# Patient Record
Sex: Female | Born: 1975 | Race: White | Hispanic: No | Marital: Married | Smoking: Never smoker
Health system: Southern US, Community
[De-identification: ages and names within clinical notes are randomized; demographics above are authoritative.]

## PROBLEM LIST (undated history)

## (undated) ENCOUNTER — Inpatient Hospital Stay (HOSPITAL_COMMUNITY): Payer: Self-pay

## (undated) DIAGNOSIS — E039 Hypothyroidism, unspecified: Secondary | ICD-10-CM

## (undated) DIAGNOSIS — Z8619 Personal history of other infectious and parasitic diseases: Secondary | ICD-10-CM

## (undated) DIAGNOSIS — F32A Depression, unspecified: Secondary | ICD-10-CM

## (undated) DIAGNOSIS — K649 Unspecified hemorrhoids: Secondary | ICD-10-CM

## (undated) DIAGNOSIS — F329 Major depressive disorder, single episode, unspecified: Secondary | ICD-10-CM

## (undated) HISTORY — DX: Personal history of other infectious and parasitic diseases: Z86.19

## (undated) HISTORY — PX: TOOTH EXTRACTION: SUR596

## (undated) HISTORY — DX: Hypothyroidism, unspecified: E03.9

## (undated) HISTORY — DX: Unspecified hemorrhoids: K64.9

---

## 2010-10-20 ENCOUNTER — Other Ambulatory Visit (HOSPITAL_COMMUNITY): Payer: Self-pay | Admitting: Obstetrics & Gynecology

## 2010-10-20 DIAGNOSIS — N970 Female infertility associated with anovulation: Secondary | ICD-10-CM

## 2010-10-23 ENCOUNTER — Ambulatory Visit (HOSPITAL_COMMUNITY): Payer: Self-pay

## 2011-08-26 LAB — OB RESULTS CONSOLE ANTIBODY SCREEN: Antibody Screen: NEGATIVE

## 2011-08-26 LAB — OB RESULTS CONSOLE ABO/RH

## 2011-08-31 LAB — OB RESULTS CONSOLE GC/CHLAMYDIA
Chlamydia: NEGATIVE
Gonorrhea: NEGATIVE

## 2011-12-17 ENCOUNTER — Encounter (HOSPITAL_COMMUNITY): Payer: Self-pay | Admitting: *Deleted

## 2011-12-17 ENCOUNTER — Inpatient Hospital Stay (HOSPITAL_COMMUNITY): Payer: BC Managed Care – PPO

## 2011-12-17 ENCOUNTER — Inpatient Hospital Stay (HOSPITAL_COMMUNITY)
Admission: AD | Admit: 2011-12-17 | Discharge: 2011-12-17 | Disposition: A | Discharge status: Home / Self Care | Payer: BC Managed Care – PPO | Source: Ambulatory Visit | Attending: Obstetrics | Admitting: Obstetrics

## 2011-12-17 DIAGNOSIS — O26859 Spotting complicating pregnancy, unspecified trimester: Secondary | ICD-10-CM | POA: Insufficient documentation

## 2011-12-17 HISTORY — DX: Major depressive disorder, single episode, unspecified: F32.9

## 2011-12-17 HISTORY — DX: Depression, unspecified: F32.A

## 2011-12-17 MED ORDER — TERCONAZOLE 0.4 % VA CREA: 1.0000 | TOPICAL_CREAM | Freq: Every day | VAGINAL | Status: AC

## 2011-12-17 NOTE — MAU Note (Signed)
Pt reports spotting today

## 2011-12-17 NOTE — H&P (Signed)
CC: vaginal spotting  HPI: 36 yo G1 at 25'6 presents w/ vaginal spotting, bright red, with wiping after void. No ctx, no achy feelings in vagina but pt realized she is hyperalert. No recent IC. Baby moving. IVF preg, female factor. No vag itching/ d/c. Patient does note hitting herself in the abdomen with the corner of a today. This did not cause any pain or significant trauma.  PMH: hypothyroid  PGynHx: no h/o abnl paps, no LEEP  Meds: synthroid, PNV All: NKDA  PE: Filed Vitals:   12/17/11 1945  BP: 111/71  Pulse: 72  Temp: 98.4 F (36.9 C)  TempSrc: Oral  Resp: 18   Abdomen: Gravid, nontender, no fundal tenderness, no right upper quadrant pain Back: No costovertebral angle tenderness Lower extremities: Nontender, no edema GU: No blood seen in the vaginal vault, normal vaginal mucosa, normal cervix, no significant ectropion, scant thick vaginal discharge, cervix long and closed, no cervical bleeding, no uterine tenderness, no adnexal mass Rectal: Multiple nonthrombosed external hemorrhoids  Toco: No contractions seen FH 150s, no decelerations, 10 beat variability, 10 x 10 accelerations  Wet prep: Numerous lactobacilli, no RBCs, rare WBCs, yeast hyphae present  Assessment and plan: 36 year old G1 at 25 weeks and 6 days who complaints of scant vaginal spotting on toilet tissue x3 during voiding earlier today. No additional bleeding, no additional staining or spotting with additional voids. Good fetal movement and no contractions  Vaginal bleeding. Very minimal by patient report, none seen on exam. possibly related to yeast infection. No evidence for preterm labor, no evidence for cervical incompetence, no evidence for placental abruption. Will treat for yeast infection. Will check ultrasound to evaluate cervical length and placenta. FSN has been collected but no indication to send at this time  Fetal well being: Prematurity. Overall reactive status  Sable Knoles A. 12/17/2011  7:59 PM

## 2012-02-16 NOTE — L&D Delivery Note (Signed)
Delivery Note At 4:48 PM a healthy female was delivered via Vaginal, Spontaneous Delivery (Presentation: manually turned from Rt post occiput to Rt ant occuput).  APGAR: 9, 9; weight pending .   Placenta status: Intact, Spontaneous.  Cord: 3 vessels with the following complications: Short.  Cord pH: Not done  Anesthesia: Epidural  Episiotomy: none Lacerations: 2nd degree Suture Repair: vicryl rapide Est. Blood Loss (mL): 400  Mom to postpartum.  Baby to nursery-stable. Informed consent for circumcision obtained verbally.  Rodriguez Aguinaldo,MARIE-LYNE 04/04/2012, 5:34 PM

## 2012-02-25 LAB — OB RESULTS CONSOLE GBS: GBS: NEGATIVE

## 2012-03-23 ENCOUNTER — Other Ambulatory Visit: Payer: Self-pay | Admitting: Obstetrics & Gynecology

## 2012-03-24 ENCOUNTER — Telehealth (HOSPITAL_COMMUNITY): Payer: Self-pay | Admitting: *Deleted

## 2012-03-24 ENCOUNTER — Encounter (HOSPITAL_COMMUNITY): Payer: Self-pay | Admitting: *Deleted

## 2012-03-24 NOTE — Telephone Encounter (Signed)
Preadmission screen  

## 2012-03-30 ENCOUNTER — Inpatient Hospital Stay (HOSPITAL_COMMUNITY): Admission: RE | Admit: 2012-03-30 | Payer: BC Managed Care – PPO | Source: Ambulatory Visit

## 2012-04-03 ENCOUNTER — Telehealth (HOSPITAL_COMMUNITY): Payer: Self-pay | Admitting: *Deleted

## 2012-04-03 ENCOUNTER — Other Ambulatory Visit: Payer: Self-pay | Admitting: Obstetrics & Gynecology

## 2012-04-03 NOTE — Telephone Encounter (Signed)
Preadmission screen  

## 2012-04-04 ENCOUNTER — Encounter (HOSPITAL_COMMUNITY): Payer: Self-pay | Admitting: Anesthesiology

## 2012-04-04 ENCOUNTER — Inpatient Hospital Stay (HOSPITAL_COMMUNITY): Payer: BC Managed Care – PPO | Admitting: Anesthesiology

## 2012-04-04 ENCOUNTER — Encounter (HOSPITAL_COMMUNITY): Payer: Self-pay

## 2012-04-04 ENCOUNTER — Inpatient Hospital Stay (HOSPITAL_COMMUNITY)
Admission: RE | Admit: 2012-04-04 | Discharge: 2012-04-06 | DRG: 373 | Disposition: A | Discharge status: Home / Self Care | Payer: BC Managed Care – PPO | Source: Ambulatory Visit | Attending: Obstetrics & Gynecology | Admitting: Obstetrics & Gynecology

## 2012-04-04 DIAGNOSIS — O878 Other venous complications in the puerperium: Secondary | ICD-10-CM | POA: Diagnosis present

## 2012-04-04 DIAGNOSIS — E079 Disorder of thyroid, unspecified: Secondary | ICD-10-CM | POA: Diagnosis present

## 2012-04-04 DIAGNOSIS — O09519 Supervision of elderly primigravida, unspecified trimester: Secondary | ICD-10-CM | POA: Diagnosis present

## 2012-04-04 DIAGNOSIS — D62 Acute posthemorrhagic anemia: Secondary | ICD-10-CM | POA: Diagnosis not present

## 2012-04-04 DIAGNOSIS — O9903 Anemia complicating the puerperium: Secondary | ICD-10-CM | POA: Diagnosis not present

## 2012-04-04 DIAGNOSIS — E039 Hypothyroidism, unspecified: Secondary | ICD-10-CM | POA: Diagnosis present

## 2012-04-04 DIAGNOSIS — O480 Post-term pregnancy: Principal | ICD-10-CM | POA: Diagnosis present

## 2012-04-04 DIAGNOSIS — K649 Unspecified hemorrhoids: Secondary | ICD-10-CM | POA: Diagnosis present

## 2012-04-04 LAB — CBC
MCH: 30.5 pg (ref 26.0–34.0)
MCHC: 33.9 g/dL (ref 30.0–36.0)
MCV: 90.1 fL (ref 78.0–100.0)
Platelets: 176 10*3/uL (ref 150–400)
RBC: 3.93 MIL/uL (ref 3.87–5.11)

## 2012-04-04 MED ORDER — CITRIC ACID-SODIUM CITRATE 334-500 MG/5ML PO SOLN: 30.0000 mL | ORAL | Status: DC | PRN

## 2012-04-04 MED ORDER — ONDANSETRON HCL 4 MG/2ML IJ SOLN: 4.0000 mg | INTRAMUSCULAR | Status: DC | PRN

## 2012-04-04 MED ORDER — IBUPROFEN 600 MG PO TABS
600.0000 mg | ORAL_TABLET | Freq: Four times a day (QID) | ORAL | Status: DC
  Administered 2012-04-04 – 2012-04-06 (×7): 600 mg via ORAL
  Filled 2012-04-04 (×7): qty 1

## 2012-04-04 MED ORDER — ERYTHROMYCIN 5 MG/GM OP OINT
TOPICAL_OINTMENT | OPHTHALMIC | Status: AC
  Filled 2012-04-04: qty 1

## 2012-04-04 MED ORDER — ONDANSETRON HCL 4 MG PO TABS: 4.0000 mg | ORAL_TABLET | ORAL | Status: DC | PRN

## 2012-04-04 MED ORDER — DIPHENHYDRAMINE HCL 50 MG/ML IJ SOLN: 12.5000 mg | INTRAMUSCULAR | Status: DC | PRN

## 2012-04-04 MED ORDER — PRENATAL MULTIVITAMIN CH
1.0000 | ORAL_TABLET | Freq: Every day | ORAL | Status: DC
  Administered 2012-04-04 – 2012-04-06 (×3): 1 via ORAL
  Filled 2012-04-04 (×3): qty 1

## 2012-04-04 MED ORDER — OXYCODONE-ACETAMINOPHEN 5-325 MG PO TABS: 1.0000 | ORAL_TABLET | ORAL | Status: DC | PRN

## 2012-04-04 MED ORDER — LIOTHYRONINE SODIUM 25 MCG PO TABS
12.5000 ug | ORAL_TABLET | Freq: Every day | ORAL | Status: DC
  Administered 2012-04-06: 10:00:00 via ORAL
  Filled 2012-04-04 (×2): qty 1

## 2012-04-04 MED ORDER — OXYTOCIN 40 UNITS IN LACTATED RINGERS INFUSION - SIMPLE MED: 62.5000 mL/h | INTRAVENOUS | Status: DC | PRN

## 2012-04-04 MED ORDER — WITCH HAZEL-GLYCERIN EX PADS
1.0000 "application " | MEDICATED_PAD | CUTANEOUS | Status: DC | PRN
  Administered 2012-04-05: 1 via TOPICAL

## 2012-04-04 MED ORDER — LACTATED RINGERS IV SOLN
500.0000 mL | Freq: Once | INTRAVENOUS | Status: AC
  Administered 2012-04-04: 09:00:00 via INTRAVENOUS

## 2012-04-04 MED ORDER — OXYTOCIN 40 UNITS IN LACTATED RINGERS INFUSION - SIMPLE MED
1.0000 m[IU]/min | INTRAVENOUS | Status: DC
  Administered 2012-04-04: 2 m[IU]/min via INTRAVENOUS
  Filled 2012-04-04: qty 1000

## 2012-04-04 MED ORDER — PHENYLEPHRINE 40 MCG/ML (10ML) SYRINGE FOR IV PUSH (FOR BLOOD PRESSURE SUPPORT): 80.0000 ug | PREFILLED_SYRINGE | INTRAVENOUS | Status: DC | PRN

## 2012-04-04 MED ORDER — OXYTOCIN 40 UNITS IN LACTATED RINGERS INFUSION - SIMPLE MED: 1.0000 m[IU]/min | INTRAVENOUS | Status: DC

## 2012-04-04 MED ORDER — PHENYLEPHRINE 40 MCG/ML (10ML) SYRINGE FOR IV PUSH (FOR BLOOD PRESSURE SUPPORT)
80.0000 ug | PREFILLED_SYRINGE | INTRAVENOUS | Status: DC | PRN
  Filled 2012-04-04 (×2): qty 5

## 2012-04-04 MED ORDER — SENNOSIDES-DOCUSATE SODIUM 8.6-50 MG PO TABS
2.0000 | ORAL_TABLET | Freq: Every day | ORAL | Status: DC
  Administered 2012-04-04 – 2012-04-05 (×2): 2 via ORAL

## 2012-04-04 MED ORDER — TETANUS-DIPHTH-ACELL PERTUSSIS 5-2.5-18.5 LF-MCG/0.5 IM SUSP: 0.5000 mL | Freq: Once | INTRAMUSCULAR | Status: DC

## 2012-04-04 MED ORDER — DIBUCAINE 1 % RE OINT
1.0000 "application " | TOPICAL_OINTMENT | RECTAL | Status: DC | PRN
  Administered 2012-04-05: 1 via RECTAL
  Filled 2012-04-04: qty 28

## 2012-04-04 MED ORDER — LIDOCAINE HCL (PF) 1 % IJ SOLN
30.0000 mL | INTRAMUSCULAR | Status: DC | PRN
  Filled 2012-04-04: qty 30

## 2012-04-04 MED ORDER — OXYTOCIN 40 UNITS IN LACTATED RINGERS INFUSION - SIMPLE MED
62.5000 mL/h | INTRAVENOUS | Status: DC
  Administered 2012-04-04: 62.5 mL/h via INTRAVENOUS

## 2012-04-04 MED ORDER — ZOLPIDEM TARTRATE 5 MG PO TABS: 5.0000 mg | ORAL_TABLET | Freq: Every evening | ORAL | Status: DC | PRN

## 2012-04-04 MED ORDER — LACTATED RINGERS IV SOLN: 500.0000 mL | INTRAVENOUS | Status: DC | PRN

## 2012-04-04 MED ORDER — IBUPROFEN 600 MG PO TABS: 600.0000 mg | ORAL_TABLET | Freq: Four times a day (QID) | ORAL | Status: DC | PRN

## 2012-04-04 MED ORDER — LIDOCAINE HCL (PF) 1 % IJ SOLN
INTRAMUSCULAR | Status: DC | PRN
  Administered 2012-04-04 (×2): 5 mL

## 2012-04-04 MED ORDER — FENTANYL 2.5 MCG/ML BUPIVACAINE 1/10 % EPIDURAL INFUSION (WH - ANES)
14.0000 mL/h | INTRAMUSCULAR | Status: DC
  Administered 2012-04-04: 14 mL/h via EPIDURAL
  Filled 2012-04-04 (×2): qty 125

## 2012-04-04 MED ORDER — BENZOCAINE-MENTHOL 20-0.5 % EX AERO
1.0000 "application " | INHALATION_SPRAY | CUTANEOUS | Status: DC | PRN
  Administered 2012-04-05: 1 via TOPICAL
  Filled 2012-04-04: qty 56

## 2012-04-04 MED ORDER — LACTATED RINGERS IV SOLN
INTRAVENOUS | Status: DC
  Administered 2012-04-04 (×3): via INTRAVENOUS

## 2012-04-04 MED ORDER — OXYTOCIN BOLUS FROM INFUSION
500.0000 mL | INTRAVENOUS | Status: DC
  Administered 2012-04-04: 500 mL via INTRAVENOUS

## 2012-04-04 MED ORDER — EPHEDRINE 5 MG/ML INJ
10.0000 mg | INTRAVENOUS | Status: DC | PRN
  Administered 2012-04-04: 10:00:00 via INTRAVENOUS
  Filled 2012-04-04 (×2): qty 4

## 2012-04-04 MED ORDER — DIPHENHYDRAMINE HCL 25 MG PO CAPS: 25.0000 mg | ORAL_CAPSULE | Freq: Four times a day (QID) | ORAL | Status: DC | PRN

## 2012-04-04 MED ORDER — ONDANSETRON HCL 4 MG/2ML IJ SOLN
4.0000 mg | Freq: Four times a day (QID) | INTRAMUSCULAR | Status: DC | PRN
  Administered 2012-04-04: 4 mg via INTRAVENOUS
  Filled 2012-04-04: qty 2

## 2012-04-04 MED ORDER — TERBUTALINE SULFATE 1 MG/ML IJ SOLN: 0.2500 mg | Freq: Once | INTRAMUSCULAR | Status: DC | PRN

## 2012-04-04 MED ORDER — FLEET ENEMA 7-19 GM/118ML RE ENEM: 1.0000 | ENEMA | Freq: Every day | RECTAL | Status: DC | PRN

## 2012-04-04 MED ORDER — LANOLIN HYDROUS EX OINT: TOPICAL_OINTMENT | CUTANEOUS | Status: DC | PRN

## 2012-04-04 MED ORDER — EPHEDRINE 5 MG/ML INJ
10.0000 mg | INTRAVENOUS | Status: DC | PRN
  Administered 2012-04-04: 10 mg via INTRAVENOUS

## 2012-04-04 MED ORDER — SIMETHICONE 80 MG PO CHEW: 80.0000 mg | CHEWABLE_TABLET | ORAL | Status: DC | PRN

## 2012-04-04 MED ORDER — ACETAMINOPHEN 325 MG PO TABS: 650.0000 mg | ORAL_TABLET | ORAL | Status: DC | PRN

## 2012-04-04 NOTE — H&P (Signed)
Subjective:  Latoya Gallagher is a 37 y.o. G1 P0 female with EDC 03/25/2012 at 28 and 3/[redacted] weeks gestation who is being admitted for induction of labor.  Her current obstetrical history is significant for post dates.  Patient reports no complaints.   Fetal Movement: normal.     Objective:   Vital signs in last 24 hours:     General:   alert  Skin:   normal  HEENT:  wnl  Lungs:   clear to auscultation bilaterally  Heart:   regular rate and rhythm  Breasts:   Deferred  Abdomen:  Gravid  Pelvis:  Exam deferred.  FHT:  140's BPM  Uterine Size: size equals dates  Presentations: cephalic  Cervix:    Dilation: 2cm+   Effacement: 80%   Station:  -1   Consistency: soft   Position: middle                                                            AROM clear AF ++ Lab Review  Rh+  Harmony neg, [place] anato wnl  One hour GTT: Normal   GBS neg   Assessment/Plan:  41 and 3/[redacted] weeks gestation. Not in labor. Obstetrical history significant for Post dates. Fetal well-being reassuring.     Risks, benefits, alternatives and possible complications have been discussed in detail with the patient.  Pre-admission, admission, and post admission procedures and expectations were discussed in detail.  All questions answered, all appropriate consents will be signed at the Hospital. Admission is planned for today.  AROM/Pitocin/Monitoring.

## 2012-04-04 NOTE — Progress Notes (Signed)
Pt placed on a left tilt during pushing and pt instructed to Not push for a couple contractions to get a FHR baseline.  MD coming to assess pt status

## 2012-04-04 NOTE — Progress Notes (Signed)
Pt transferred to mbw, rm146 via wheelchair. Fob and family at bedside. Newborn transferred to room with mother. Pt feeling a little better, eating and drinking.

## 2012-04-04 NOTE — Anesthesia Preprocedure Evaluation (Addendum)
Anesthesia Evaluation  Patient identified by MRN, date of birth, ID band Patient awake    Reviewed: Allergy & Precautions, H&P , Patient's Chart, lab work & pertinent test results  Airway Mallampati: II TM Distance: >3 FB Neck ROM: full    Dental no notable dental hx.    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     Unspecified hemorrhoids without mention of complication        Hypothyroidism    Reproductive/Obstetrics (+) Pregnancy                           Anesthesia Physical Anesthesia Plan  ASA: II  Anesthesia Plan: Epidural   Post-op Pain Management:    Induction:   Airway Management Planned:   Additional Equipment:   Intra-op Plan:   Post-operative Plan:   Informed Consent: I have reviewed the patients History and Physical, chart, labs and discussed the procedure including the risks, benefits and alternatives for the proposed anesthesia with the patient or authorized representative who has indicated his/her understanding and acceptance.     Plan Discussed with:   Anesthesia Plan Comments:        Anesthesia Quick Evaluation

## 2012-04-04 NOTE — Progress Notes (Signed)
Pt up to bathroom with stedy, unable to void. Rn called out for assistance. Pt passed out in bathroom, second RN assisted with taking pt back to bed. Juice given and pt eating. Pt up to wheelchair, still feels faint. Taking pt to mother baby room 146 to lay down.

## 2012-04-04 NOTE — Anesthesia Procedure Notes (Signed)
Epidural Patient location during procedure: OB Start time: 04/04/2012 9:30 AM  Staffing Anesthesiologist: Angus Seller., Harrell Gave. Performed by: anesthesiologist   Preanesthetic Checklist Completed: patient identified, site marked, surgical consent, pre-op evaluation, timeout performed, IV checked, risks and benefits discussed and monitors and equipment checked  Epidural Patient position: sitting Prep: site prepped and draped and DuraPrep Patient monitoring: continuous pulse ox and blood pressure Approach: midline Injection technique: LOR air and LOR saline  Needle:  Needle type: Tuohy  Needle gauge: 17 G Needle length: 9 cm and 9 Needle insertion depth: 4 cm Catheter type: closed end flexible Catheter size: 19 Gauge Catheter at skin depth: 10 cm Test dose: negative  Assessment Events: blood not aspirated (.epi), injection not painful, no injection resistance, negative IV test and no paresthesia  Additional Notes Patient identified.  Risk benefits discussed including failed block, incomplete pain control, headache, nerve damage, paralysis, blood pressure changes, nausea, vomiting, reactions to medication both toxic or allergic, and postpartum back pain.  Patient expressed understanding and wished to proceed.  All questions were answered.  Sterile technique used throughout procedure and epidural site dressed with sterile barrier dressing. No paresthesia or other complications noted.The patient did not experience any signs of intravascular injection such as tinnitus or metallic taste in mouth nor signs of intrathecal spread such as rapid motor block. Please see nursing notes for vital signs.

## 2012-04-05 ENCOUNTER — Encounter (HOSPITAL_COMMUNITY): Payer: Self-pay

## 2012-04-05 LAB — CBC
MCHC: 34.2 g/dL (ref 30.0–36.0)
MCV: 89.9 fL (ref 78.0–100.0)
Platelets: 137 10*3/uL — ABNORMAL LOW (ref 150–400)
RDW: 12.6 % (ref 11.5–15.5)
WBC: 12.6 10*3/uL — ABNORMAL HIGH (ref 4.0–10.5)

## 2012-04-05 MED ORDER — HYDROCORTISONE ACE-PRAMOXINE 1-1 % RE FOAM
1.0000 | Freq: Two times a day (BID) | RECTAL | Status: DC
  Administered 2012-04-05 – 2012-04-06 (×3): 1 via RECTAL
  Filled 2012-04-05: qty 10

## 2012-04-05 NOTE — Anesthesia Postprocedure Evaluation (Signed)
  Anesthesia Post-op Note  Patient: Latoya Gallagher  Procedure(s) Performed: * No procedures listed *  Patient Location: PACU and Mother/Baby  Anesthesia Type:Epidural  Level of Consciousness: awake, alert  and oriented  Airway and Oxygen Therapy: Patient Spontanous Breathing  Post-op Pain: none  Post-op Assessment: Post-op Vital signs reviewed  Post-op Vital Signs: Reviewed and stable  Complications: No apparent anesthesia complications

## 2012-04-05 NOTE — Progress Notes (Signed)
Patient ID: Latoya Gallagher, female   DOB: March 08, 1975, 37 y.o.   MRN: [id number] PPD # 1  Subjective: Pt reports feeling well/ Pain controlled with ibuprofen Tolerating po/ Voiding without problems/ No n/v Bleeding is light Newborn info:  Information for the patient's newborn:  Latoya, Gallagher [[id number]]  female  / circ completed this am per Dr Latoya Gallagher Feeding: breast   Objective:  VS: Blood pressure 102/63, pulse 73, temperature 98.7 F (37.1 C), temperature source Oral, resp. rate 20.    Recent Labs  04/04/12 0730 04/05/12 0510  WBC 6.3 12.6*  HGB 12.0 8.2*  HCT 35.4* 24.0*  PLT 176 137*    Blood type: A/Positive/-- (07/11 0000) Rubella: Immune (07/11 0000)    Physical Exam:  General: A & O x 3  alert, cooperative and no distress CV: Regular rate and rhythm Resp: clear Abdomen: soft, nontender, normal bowel sounds Uterine Fundus: firm, below umbilicus, nontender Perineum: healing with good reapproximation Lochia: moderate Rectum: cluster of 4 to 6 hemorrrhoids, approx .5cm in size each; soft and pink; not thrombosed Ext: Homans sign is negative, no sign of DVT and no edema, redness or tenderness in the calves or thighs   A/P: PPD # 1/ G1P1001/ S/P: SVD w/2nd degree repair Hemorrhoids; meds a/o Doing well Continue routine post partum orders Anticipate D/C home in AM    Latoya Revel, MSN, [facility] 04/05/2012, 12:21 PM

## 2012-04-06 MED ORDER — HYDROCORTISONE ACE-PRAMOXINE 1-1 % RE FOAM: 1.0000 | Freq: Three times a day (TID) | RECTAL | Status: AC

## 2012-04-06 MED ORDER — POLYSACCHARIDE IRON COMPLEX 150 MG PO CAPS: 150.0000 mg | ORAL_CAPSULE | ORAL | Status: DC

## 2012-04-06 MED ORDER — IBUPROFEN 600 MG PO TABS: 600.0000 mg | ORAL_TABLET | Freq: Four times a day (QID) | ORAL | Status: DC

## 2012-04-06 MED ORDER — DOCUSATE SODIUM 100 MG PO CAPS: 100.0000 mg | ORAL_CAPSULE | Freq: Two times a day (BID) | ORAL | Status: AC

## 2012-04-06 NOTE — Progress Notes (Signed)
Pt discharged before CSW could assess pt's history of depression.   

## 2012-04-06 NOTE — Progress Notes (Signed)
PPD 2 SVD  S:  Reports feeling ok - hemorrhoids still painful but better today             Tolerating po/ No nausea or vomiting             Bleeding is light             Pain controlled with motrin only (desires OTC for discharge home)             Up ad lib / ambulatory / voiding QS  Newborn breast-feeding   O:               VS: BP 108/67  Pulse 71  Temp(Src) 98.3 F (36.8 C) (Oral)  Resp 18  Ht 5' 5.5" (1.664 m)  Wt 62.596 kg (138 lb)  BMI 22.61 kg/m2  SpO2 100%   LABS:  Recent Labs  04/04/12 0730 04/05/12 0510  WBC 6.3 12.6*  HGB 12.0 8.2*  PLT 176 137*                             Physical Exam:             Alert and oriented X3  Lungs: Clear and unlabored  Heart: regular rate and rhythm / no mumurs  Abdomen: soft, non-tender, non-distended              Fundus: firm, non-tender, U-1  Perineum: mild edema / pink inflamed hemorrhoidal cluster  Lochia: light  Extremities: trace edema, no calf pain or tenderness    A: PPD # 2 - stable status             ABL anemia             hemorrhoids   Doing well - stable status  P:  Routine post partum orders  WOB booklet - instructions reviewed             Dc home  Marlinda Mike CNM, MSN 04/06/2012, 9:11 AM

## 2012-04-06 NOTE — Discharge Summary (Signed)
Obstetric Discharge Summary  Reason for Admission: [redacted] weeks gestation / hypothyroidism /  induction of labor - postdates Prenatal Procedures: NST and ultrasound Intrapartum Procedures: spontaneous vaginal delivery Postpartum Procedures: none Complications-Operative and Postpartum: 2nd degree perineal laceration Hemoglobin  Date Value Range Status  04/05/2012 8.2* 12.0 - 15.0 g/dL Final     REPEATED TO VERIFY     DELTA CHECK NOTED     HCT  Date Value Range Status  04/05/2012 24.0* 36.0 - 46.0 % Final    Physical Exam:  General: alert, cooperative, fatigued and no distress Lochia: appropriate Uterine Fundus: firm Incision: healing well DVT Evaluation: No evidence of DVT seen on physical exam.  Discharge Diagnoses: Term Pregnancy-delivered / ABL anemia / hemorrhoids / hypothyroidism  Discharge Information: Date: 04/06/2012 Activity: pelvic rest Diet: routine Medications: PNV, Ibuprofen, Colace, Iron and Anusol-HC Condition: stable Instructions: refer to practice specific booklet Discharge to: home Follow-up Information   Follow up with Latoya Gallagher,MARIE-LYNE, MD. Schedule an appointment as soon as possible for a visit in 6 weeks.   Contact information:   [address] [place] [address] [phone]       Newborn Data: Live born female  Birth Weight: 7 lb 13.8 oz (3566 g) APGAR: 9, 9  Home with mother.  Latoya Gallagher 04/06/2012, 9:17 AM

## 2013-12-17 ENCOUNTER — Encounter (HOSPITAL_COMMUNITY): Payer: Self-pay

## 2014-02-15 NOTE — L&D Delivery Note (Signed)
Delivery Note At 4:16 PM a healthy female was delivered via Vaginal, Spontaneous Delivery (Presentation: Right Occiput Anterior).  APGAR: 9, 9; weight  pending.   Placenta status: Intact, Spontaneous.  Cord: 3 vessels with the following complications: None.  Cord pH: N/A  Anesthesia: Epidural  Episiotomy: None Lacerations: 1st degree, bilateral vulvar tears. Suture Repair: 3.0 vicryl rapide Est. Blood Loss (mL): 249  Mom to postpartum.  Baby to Couplet care / Skin to Skin.  LAVOIE,MARIE-LYNE 11/05/2014, 5:06 PM

## 2014-04-03 LAB — OB RESULTS CONSOLE ABO/RH: RH Type: POSITIVE

## 2014-04-03 LAB — OB RESULTS CONSOLE HIV ANTIBODY (ROUTINE TESTING): HIV: NONREACTIVE

## 2014-04-03 LAB — OB RESULTS CONSOLE HEPATITIS B SURFACE ANTIGEN: HEP B S AG: NEGATIVE

## 2014-04-03 LAB — OB RESULTS CONSOLE ANTIBODY SCREEN: Antibody Screen: NEGATIVE

## 2014-04-03 LAB — OB RESULTS CONSOLE RUBELLA ANTIBODY, IGM: RUBELLA: IMMUNE

## 2014-04-03 LAB — OB RESULTS CONSOLE RPR: RPR: NONREACTIVE

## 2014-04-09 LAB — OB RESULTS CONSOLE GC/CHLAMYDIA
Chlamydia: NEGATIVE
Gonorrhea: NEGATIVE

## 2014-08-15 LAB — OB RESULTS CONSOLE RPR: RPR: NONREACTIVE

## 2014-10-01 LAB — OB RESULTS CONSOLE GBS: STREP GROUP B AG: NEGATIVE

## 2014-11-01 ENCOUNTER — Encounter (HOSPITAL_COMMUNITY): Payer: Self-pay | Admitting: *Deleted

## 2014-11-01 ENCOUNTER — Telehealth (HOSPITAL_COMMUNITY): Payer: Self-pay | Admitting: *Deleted

## 2014-11-01 ENCOUNTER — Other Ambulatory Visit: Payer: Self-pay | Admitting: Obstetrics & Gynecology

## 2014-11-01 NOTE — Telephone Encounter (Signed)
Preadmission screen  

## 2014-11-05 ENCOUNTER — Inpatient Hospital Stay (HOSPITAL_COMMUNITY)
Admission: AD | Admit: 2014-11-05 | Payer: BC Managed Care – PPO | Source: Ambulatory Visit | Admitting: Obstetrics & Gynecology

## 2014-11-05 ENCOUNTER — Inpatient Hospital Stay (HOSPITAL_COMMUNITY)
Admission: RE | Admit: 2014-11-05 | Discharge: 2014-11-07 | DRG: 775 | Disposition: A | Discharge status: Home / Self Care | Payer: BC Managed Care – PPO | Source: Ambulatory Visit | Attending: Obstetrics & Gynecology | Admitting: Obstetrics & Gynecology

## 2014-11-05 ENCOUNTER — Inpatient Hospital Stay (HOSPITAL_COMMUNITY): Payer: BC Managed Care – PPO | Admitting: Anesthesiology

## 2014-11-05 ENCOUNTER — Encounter (HOSPITAL_COMMUNITY): Payer: Self-pay

## 2014-11-05 DIAGNOSIS — O2243 Hemorrhoids in pregnancy, third trimester: Secondary | ICD-10-CM | POA: Diagnosis present

## 2014-11-05 DIAGNOSIS — O9081 Anemia of the puerperium: Secondary | ICD-10-CM | POA: Diagnosis present

## 2014-11-05 DIAGNOSIS — Z8249 Family history of ischemic heart disease and other diseases of the circulatory system: Secondary | ICD-10-CM | POA: Diagnosis not present

## 2014-11-05 DIAGNOSIS — O26893 Other specified pregnancy related conditions, third trimester: Secondary | ICD-10-CM | POA: Diagnosis present

## 2014-11-05 DIAGNOSIS — O99280 Endocrine, nutritional and metabolic diseases complicating pregnancy, unspecified trimester: Secondary | ICD-10-CM

## 2014-11-05 DIAGNOSIS — D649 Anemia, unspecified: Secondary | ICD-10-CM | POA: Diagnosis present

## 2014-11-05 DIAGNOSIS — O99284 Endocrine, nutritional and metabolic diseases complicating childbirth: Secondary | ICD-10-CM | POA: Diagnosis present

## 2014-11-05 DIAGNOSIS — O09523 Supervision of elderly multigravida, third trimester: Secondary | ICD-10-CM | POA: Diagnosis not present

## 2014-11-05 DIAGNOSIS — Z3A40 40 weeks gestation of pregnancy: Secondary | ICD-10-CM | POA: Diagnosis present

## 2014-11-05 DIAGNOSIS — E039 Hypothyroidism, unspecified: Secondary | ICD-10-CM | POA: Diagnosis present

## 2014-11-05 DIAGNOSIS — Z823 Family history of stroke: Secondary | ICD-10-CM

## 2014-11-05 LAB — TYPE AND SCREEN
ABO/RH(D): A POS
Antibody Screen: NEGATIVE

## 2014-11-05 LAB — CBC
HCT: 35.2 % — ABNORMAL LOW (ref 36.0–46.0)
HEMOGLOBIN: 11.8 g/dL — AB (ref 12.0–15.0)
MCH: 30.3 pg (ref 26.0–34.0)
MCHC: 33.5 g/dL (ref 30.0–36.0)
MCV: 90.3 fL (ref 78.0–100.0)
PLATELETS: 188 10*3/uL (ref 150–400)
RBC: 3.9 MIL/uL (ref 3.87–5.11)
RDW: 13.4 % (ref 11.5–15.5)
WBC: 6.6 10*3/uL (ref 4.0–10.5)

## 2014-11-05 LAB — ABO/RH: ABO/RH(D): A POS

## 2014-11-05 MED ORDER — WITCH HAZEL-GLYCERIN EX PADS: 1.0000 "application " | MEDICATED_PAD | CUTANEOUS | Status: DC | PRN

## 2014-11-05 MED ORDER — OXYTOCIN 40 UNITS IN LACTATED RINGERS INFUSION - SIMPLE MED: 62.5000 mL/h | INTRAVENOUS | Status: DC | PRN

## 2014-11-05 MED ORDER — IBUPROFEN 600 MG PO TABS
600.0000 mg | ORAL_TABLET | Freq: Four times a day (QID) | ORAL | Status: DC
  Administered 2014-11-05 – 2014-11-07 (×8): 600 mg via ORAL
  Filled 2014-11-05 (×7): qty 1

## 2014-11-05 MED ORDER — BENZOCAINE-MENTHOL 20-0.5 % EX AERO
1.0000 "application " | INHALATION_SPRAY | CUTANEOUS | Status: DC | PRN
  Administered 2014-11-07: 1 via TOPICAL
  Filled 2014-11-05 (×2): qty 56

## 2014-11-05 MED ORDER — ACETAMINOPHEN 325 MG PO TABS
650.0000 mg | ORAL_TABLET | ORAL | Status: DC | PRN
  Administered 2014-11-06 – 2014-11-07 (×3): 650 mg via ORAL
  Filled 2014-11-05 (×3): qty 2

## 2014-11-05 MED ORDER — CITRIC ACID-SODIUM CITRATE 334-500 MG/5ML PO SOLN: 30.0000 mL | ORAL | Status: DC | PRN

## 2014-11-05 MED ORDER — OXYTOCIN BOLUS FROM INFUSION: 500.0000 mL | INTRAVENOUS | Status: DC

## 2014-11-05 MED ORDER — OXYCODONE-ACETAMINOPHEN 5-325 MG PO TABS: 2.0000 | ORAL_TABLET | ORAL | Status: DC | PRN

## 2014-11-05 MED ORDER — ONDANSETRON HCL 4 MG/2ML IJ SOLN: 4.0000 mg | INTRAMUSCULAR | Status: DC | PRN

## 2014-11-05 MED ORDER — ONDANSETRON HCL 4 MG/2ML IJ SOLN: 4.0000 mg | Freq: Four times a day (QID) | INTRAMUSCULAR | Status: DC | PRN

## 2014-11-05 MED ORDER — LANOLIN HYDROUS EX OINT
TOPICAL_OINTMENT | CUTANEOUS | Status: DC | PRN
Start: 2014-11-05 — End: 2014-11-07

## 2014-11-05 MED ORDER — TERBUTALINE SULFATE 1 MG/ML IJ SOLN
0.2500 mg | Freq: Once | INTRAMUSCULAR | Status: DC | PRN
  Filled 2014-11-05: qty 1

## 2014-11-05 MED ORDER — LIDOCAINE HCL (PF) 1 % IJ SOLN
30.0000 mL | INTRAMUSCULAR | Status: DC | PRN
  Filled 2014-11-05: qty 30

## 2014-11-05 MED ORDER — SIMETHICONE 80 MG PO CHEW: 80.0000 mg | CHEWABLE_TABLET | ORAL | Status: DC | PRN

## 2014-11-05 MED ORDER — ACETAMINOPHEN 325 MG PO TABS: 650.0000 mg | ORAL_TABLET | ORAL | Status: DC | PRN

## 2014-11-05 MED ORDER — LIOTHYRONINE SODIUM 25 MCG PO TABS: 25.0000 ug | ORAL_TABLET | ORAL | Status: DC

## 2014-11-05 MED ORDER — OXYTOCIN 40 UNITS IN LACTATED RINGERS INFUSION - SIMPLE MED
1.0000 m[IU]/min | INTRAVENOUS | Status: DC
  Administered 2014-11-05: 2 m[IU]/min via INTRAVENOUS
  Administered 2014-11-05: 20 m[IU]/min via INTRAVENOUS
  Filled 2014-11-05: qty 1000

## 2014-11-05 MED ORDER — FENTANYL 2.5 MCG/ML BUPIVACAINE 1/10 % EPIDURAL INFUSION (WH - ANES)
14.0000 mL/h | INTRAMUSCULAR | Status: DC | PRN
  Administered 2014-11-05: 14 mL/h via EPIDURAL
  Filled 2014-11-05: qty 125

## 2014-11-05 MED ORDER — LIOTHYRONINE SODIUM 25 MCG PO TABS: 12.5000 ug | ORAL_TABLET | Freq: Every day | ORAL | Status: DC

## 2014-11-05 MED ORDER — LACTATED RINGERS IV SOLN
500.0000 mL | INTRAVENOUS | Status: DC | PRN
  Administered 2014-11-05 (×3): 1000 mL via INTRAVENOUS

## 2014-11-05 MED ORDER — LACTATED RINGERS IV SOLN
INTRAVENOUS | Status: DC
Start: 2014-11-05 — End: 2014-11-05
  Administered 2014-11-05: 09:00:00 via INTRAVENOUS

## 2014-11-05 MED ORDER — PHENYLEPHRINE 40 MCG/ML (10ML) SYRINGE FOR IV PUSH (FOR BLOOD PRESSURE SUPPORT)
PREFILLED_SYRINGE | INTRAVENOUS | Status: AC
  Filled 2014-11-05: qty 20

## 2014-11-05 MED ORDER — DIPHENHYDRAMINE HCL 25 MG PO CAPS: 25.0000 mg | ORAL_CAPSULE | Freq: Four times a day (QID) | ORAL | Status: DC | PRN

## 2014-11-05 MED ORDER — OXYCODONE-ACETAMINOPHEN 5-325 MG PO TABS
1.0000 | ORAL_TABLET | ORAL | Status: DC | PRN
  Filled 2014-11-05: qty 1

## 2014-11-05 MED ORDER — SODIUM BICARBONATE 8.4 % IV SOLN
INTRAVENOUS | Status: DC | PRN
  Administered 2014-11-05: 5 mL via EPIDURAL

## 2014-11-05 MED ORDER — DIPHENHYDRAMINE HCL 50 MG/ML IJ SOLN: 12.5000 mg | INTRAMUSCULAR | Status: DC | PRN

## 2014-11-05 MED ORDER — ZOLPIDEM TARTRATE 5 MG PO TABS: 5.0000 mg | ORAL_TABLET | Freq: Every evening | ORAL | Status: DC | PRN

## 2014-11-05 MED ORDER — ONDANSETRON HCL 4 MG PO TABS: 4.0000 mg | ORAL_TABLET | ORAL | Status: DC | PRN

## 2014-11-05 MED ORDER — LIDOCAINE HCL (PF) 1 % IJ SOLN
INTRAMUSCULAR | Status: DC | PRN
  Administered 2014-11-05: 5 mL via EPIDURAL
  Administered 2014-11-05: 2 mL via EPIDURAL
  Administered 2014-11-05: 3 mL via EPIDURAL

## 2014-11-05 MED ORDER — TETANUS-DIPHTH-ACELL PERTUSSIS 5-2.5-18.5 LF-MCG/0.5 IM SUSP: 0.5000 mL | Freq: Once | INTRAMUSCULAR | Status: DC

## 2014-11-05 MED ORDER — LIOTHYRONINE SODIUM 5 MCG PO TABS
12.5000 ug | ORAL_TABLET | ORAL | Status: DC
  Administered 2014-11-06 – 2014-11-07 (×2): 12.5 ug via ORAL
  Filled 2014-11-05 (×2): qty 3

## 2014-11-05 MED ORDER — OXYCODONE-ACETAMINOPHEN 5-325 MG PO TABS: 1.0000 | ORAL_TABLET | ORAL | Status: DC | PRN

## 2014-11-05 MED ORDER — OXYTOCIN 40 UNITS IN LACTATED RINGERS INFUSION - SIMPLE MED
62.5000 mL/h | INTRAVENOUS | Status: DC
  Administered 2014-11-05: 62.5 mL/h via INTRAVENOUS

## 2014-11-05 MED ORDER — SENNOSIDES-DOCUSATE SODIUM 8.6-50 MG PO TABS
2.0000 | ORAL_TABLET | ORAL | Status: DC
  Administered 2014-11-05 – 2014-11-07 (×2): 2 via ORAL
  Filled 2014-11-05 (×2): qty 2

## 2014-11-05 MED ORDER — DIBUCAINE 1 % RE OINT
1.0000 "application " | TOPICAL_OINTMENT | RECTAL | Status: DC | PRN
  Administered 2014-11-06: 1 via RECTAL
  Filled 2014-11-05 (×2): qty 28

## 2014-11-05 MED ORDER — EPHEDRINE 5 MG/ML INJ
10.0000 mg | INTRAVENOUS | Status: DC | PRN
  Filled 2014-11-05: qty 2

## 2014-11-05 MED ORDER — PHENYLEPHRINE 40 MCG/ML (10ML) SYRINGE FOR IV PUSH (FOR BLOOD PRESSURE SUPPORT)
80.0000 ug | PREFILLED_SYRINGE | INTRAVENOUS | Status: DC | PRN
  Administered 2014-11-05 (×2): 40 ug via INTRAVENOUS
  Filled 2014-11-05: qty 2
  Filled 2014-11-05: qty 20

## 2014-11-05 MED ORDER — PRENATAL MULTIVITAMIN CH
1.0000 | ORAL_TABLET | Freq: Every day | ORAL | Status: DC
  Administered 2014-11-06 – 2014-11-07 (×2): 1 via ORAL
  Filled 2014-11-05 (×2): qty 1

## 2014-11-05 NOTE — Anesthesia Preprocedure Evaluation (Signed)
Anesthesia Evaluation  Patient identified by MRN, date of birth, ID band Patient awake    Reviewed: Allergy & Precautions, H&P , Patient's Chart, lab work & pertinent test results  History of Anesthesia Complications Negative for: history of anesthetic complications  Airway Mallampati: II  TM Distance: >3 FB Neck ROM: full    Dental no notable dental hx.    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     Unspecified hemorrhoids without mention of complication        Hypothyroidism    Reproductive/Obstetrics (+) Pregnancy                             Anesthesia Physical  Anesthesia Plan  ASA: II  Anesthesia Plan: Epidural   Post-op Pain Management:    Induction:   Airway Management Planned:   Additional Equipment:   Intra-op Plan:   Post-operative Plan:   Informed Consent: I have reviewed the patients History and Physical, chart, labs and discussed the procedure including the risks, benefits and alternatives for the proposed anesthesia with the patient or authorized representative who has indicated his/her understanding and acceptance.   Dental advisory given  Plan Discussed with:   Anesthesia Plan Comments: (Patient identified. Risks/Benefits/Options discussed with patient including but not limited to bleeding, infection, nerve damage, paralysis,  failed block, incomplete pain control, headache, blood pressure changes, nausea, vomiting, reactions to medication both or allergic, itching and postpartum back pain. Confirmed with bedside nurse the patient's most recent platelet count. Confirmed with patient that they are not currently taking any anticoagulation, have any bleeding history or any family history of bleeding disorders. Patient expressed understanding and wished to proceed. All questions were answered. )        Anesthesia Quick Evaluation

## 2014-11-05 NOTE — H&P (Signed)
Kaelan Emami is a 39 y.o. female G2P1001 [redacted]w[redacted]d presenting for Induction of labor.  OB History    Gravida Para Term Preterm AB TAB SAB Ectopic Multiple Living   Past Medical History  Diagnosis Date  . Depression   . Hx of varicella   . Unspecified hemorrhoids without mention of complication   . Hypothyroidism    Past Surgical History  Procedure Laterality Date  . Tooth extraction     Family History: family history includes Heart attack in her maternal grandfather and paternal grandfather; Hypertension in her maternal grandmother and paternal grandmother; Hypothyroidism in her father and sister; Migraines in her mother; Stroke in her maternal grandmother and paternal grandmother. There is no history of Other. Social History:  reports that she has never smoked. She has never used smokeless tobacco. She reports that she does not drink alcohol or use illicit drugs.  No Known Allergies   Blood pressure 113/37, pulse 86, temperature 98.7 F (37.1 C), temperature source Oral, resp. rate 18, height  (1.651 m), weight 135 lb (61.236 kg), last menstrual period 01/31/2014, unknown if currently breastfeeding. Exam Physical Exam   VE 2 cm/80%/Vtx/-1  Membranes intact.  Cervix too post to AROM at 8:30 am AROM AF clear ++ at 12:55, VE 4/90%/Vtx/-1   FHR 140's with good variability, accelerations present, no deceleration.  HPP:  Patient Active Problem List   Diagnosis Date Noted  . Hypothyroidism affecting pregnancy 11/05/2014  . SVD (spontaneous vaginal delivery) 04/05/2012  . Postpartum care following vaginal delivery (2/18) 04/05/2012    Prenatal labs: ABO, Rh: --/--/A POS, A POS (09/20 0840) Antibody: NEG (09/20 0840) Rubella:  Immune RPR: Nonreactive (06/30 0000)  HBsAg: Negative (02/17 0000)  HIV: Non-reactive (02/17 0000)  Genetic testing: Informaseq wnl, AFP1 neg [place] anato: wnl 1 hr GTT: wnl GBS: Negative (08/16 0000)   Assessment/Plan: 40 wks  induction for AMA 39 yo.  Favorable cervix.  AGA.  Normal pregnancy.  GBS neg.  Induction Pitocin/AROM.  FHR monitoring Cat 1. Mild Hypothyroidism on Cytomel.   LAVOIE,MARIE-LYNE 11/05/2014, 4:54 PM

## 2014-11-05 NOTE — Anesthesia Procedure Notes (Signed)
Epidural Patient location during procedure: OB  Staffing Anesthesiologist: TURK, STEPHEN EDWARD Performed by: anesthesiologist   Preanesthetic Checklist Completed: patient identified, pre-op evaluation, timeout performed, IV checked, risks and benefits discussed and monitors and equipment checked  Epidural Patient position: sitting Prep: DuraPrep Patient monitoring: blood pressure and continuous pulse ox Approach: midline Location: L3-L4 Injection technique: LOR air  Needle:  Needle type: Tuohy  Needle gauge: 17 G Needle length: 9 cm Needle insertion depth: 4 cm Catheter size: 19 Gauge Catheter at skin depth: 10 cm Test dose: negative and Other (1% Lidocaine)  Additional Notes Patient identified.  Risk benefits discussed including failed block, incomplete pain control, headache, nerve damage, paralysis, blood pressure changes, nausea, vomiting, reactions to medication both toxic or allergic, and postpartum back pain.  Patient expressed understanding and wished to proceed.  All questions were answered.  Sterile technique used throughout procedure and epidural site dressed with sterile barrier dressing. No paresthesia or other complications noted. The patient did not experience any signs of intravascular injection such as tinnitus or metallic taste in mouth nor signs of intrathecal spread such as rapid motor block. Please see nursing notes for vital signs. Reason for block:procedure for pain   

## 2014-11-06 LAB — CBC
HCT: 29.6 % — ABNORMAL LOW (ref 36.0–46.0)
Hemoglobin: 10 g/dL — ABNORMAL LOW (ref 12.0–15.0)
MCH: 30.6 pg (ref 26.0–34.0)
MCHC: 33.8 g/dL (ref 30.0–36.0)
MCV: 90.5 fL (ref 78.0–100.0)
PLATELETS: 153 10*3/uL (ref 150–400)
RBC: 3.27 MIL/uL — AB (ref 3.87–5.11)
RDW: 13.5 % (ref 11.5–15.5)
WBC: 8.5 10*3/uL (ref 4.0–10.5)

## 2014-11-06 LAB — RPR: RPR Ser Ql: NONREACTIVE

## 2014-11-06 NOTE — Progress Notes (Signed)
PPD #1 SVD  S:  Pt reports feeling well/ Tolerating po/ Voiding without problems/ No n/v/ Bleeding is mod wnl/ Pain controlled             with Ibuprofen  Newborn  Girl doing well             Breastfeeding   O:  A & O x 3 Filed Vitals:   11/06/14 0925  BP: 116/49  Pulse: 70  Temp: 98 F (36.7 C)  Resp: 16    LABS:  Lab Results  Component Value Date   WBC 8.5 11/06/2014   HGB 10.0* 11/06/2014   HCT 29.6* 11/06/2014   MCV 90.5 11/06/2014   PLT 153 11/06/2014      Lungs: clear  Heart: rcr  Abdomen: soft  Perineum: intact  UH: 0/2 firm  Lochia: normal  Extremities: normal    A/P: PPD # 1/ [id number]  Mild Anemia.  Iron-rich food.  Doing well  Continue routine post partum orders             D/C tomorrow.  Genia Del MD  11/06/2014 at 11:09 am

## 2014-11-06 NOTE — Lactation Note (Signed)
This note was copied from the chart of Latoya Gallagher. Lactation Consultation Note  Initial Consultation with 80 hour old [name]. Mom reports she BF he 2.5 yo son for 18 months and denies any problems with BF. Mom says she has some soreness to nipples that improves with latch, no bruising/ cracking noted to nipples. Mom has erect nipples and soft breasts. She has Breast Shells in room that she requested from nurse as she used them for sore nipples with first child. She has not applied them as she does not have bra on. Enc her to hand express colostrum and rub on nipples post feed and air dry and to use coconut oil or olive oil at home. Baby has fed 11 BF, 2 voids and 2 stools in last 24 hours. Baby alert and was Cueing to feed, mom reports that she just fed for 45 minutes. Asked mom to latch infant to assess latch/feed. She is using support pillows and latches baby independently, enc her to pull infant on deeper with wide open mouth and not allow infant to suck nipple through lips to assist in aleviating nipple soreness. Enc. Mom to massage/ compress breast during feeding. Infant nursed for a few minutes and audible swallows were heard.  Mom reports she is able to hand express milk. [facility] Brochure given with LC phone #, informed mom of OP services, Support Groups, and BF Resources handout. Enc mom to call prn questions/concerns.  Patient Name: Latoya Aaleeyah Bias [id number] Date: 11/06/2014 Reason for consult: Initial assessment   Maternal Data Formula Feeding for Exclusion: No Does the patient have breastfeeding experience prior to this delivery?: Yes  Feeding Feeding Type: Breast Fed  LATCH Score/Interventions Latch: Grasps breast easily, tongue down, lips flanged, rhythmical sucking.  Audible Swallowing: Spontaneous and intermittent Intervention(s): Skin to skin  Type of Nipple: Everted at rest and after stimulation  Comfort (Breast/Nipple): Filling, red/small blisters or bruises, mild/mod  discomfort  Problem noted: Mild/Moderate discomfort Interventions (Mild/moderate discomfort): Hand expression;Hand massage  Hold (Positioning): No assistance needed to correctly position infant at breast.  LATCH Score: 9  Lactation Tools Discussed/Used     Consult Status Consult Status: PRN    Ed Blalock 11/06/2014, 4:08 PM

## 2014-11-06 NOTE — Anesthesia Postprocedure Evaluation (Signed)
  Anesthesia Post-op Note  Patient: Latoya Gallagher  Procedure(s) Performed: * No procedures listed *  Patient Location: PACU and Mother/Baby  Anesthesia Type:Epidural  Level of Consciousness: awake, alert  and oriented  Airway and Oxygen Therapy: Patient Spontanous Breathing  Post-op Pain: none  Post-op Assessment: Post-op Vital signs reviewed, Patient's Cardiovascular Status Stable, No headache, No backache, No residual numbness and No residual motor weakness  Post-op Vital Signs: Reviewed and stable  Complications: No apparent anesthesia complications

## 2014-11-07 MED ORDER — IBUPROFEN 600 MG PO TABS: 600.0000 mg | ORAL_TABLET | Freq: Four times a day (QID) | ORAL | Status: AC

## 2014-11-07 NOTE — Progress Notes (Signed)
PPD #2- SVD  Subjective:   Reports feeling well, ready for discharge Tolerating po/ No nausea or vomiting Bleeding is light Pain controlled with Motrin Up ad lib / ambulatory / voiding without problems Newborn: breastfeeding    Objective:   VS: VS:  Filed Vitals:   11/06/14 0621 11/06/14 0925 11/06/14 1846 11/07/14 0611  BP: 90/56 116/49 98/56 100/51  Pulse: 54 70 53 51  Temp: 98.5 F (36.9 C) 98 F (36.7 C) 98.3 F (36.8 C) 98 F (36.7 C)  TempSrc: Oral Oral Oral Oral  Resp: Height:      Weight:      SpO2:  100%      LABS:  Recent Labs  11/05/14 0840 11/06/14 0610  WBC 6.6 8.5  HGB 11.8* 10.0*  PLT 188 153   Blood type: --/--/A POS, A POS (09/20 0840) Rubella: Immune (02/17 0000)                I&O: Intake/Output      09/21 0701 - 09/22 0700 09/22 0701 - 09/23 0700   Blood     Total Output       Net              Physical Exam: Alert and oriented X3 Abdomen: soft, non-tender, non-distended  Fundus: firm, non-tender, U-2 Perineum: Well approximated, no significant erythema, edema, or drainage; healing well. Cluster if 3 hemorrhoids present, edematous. Lochia: small Extremities: No edema, no calf pain or tenderness   Assessment: PPD #2  G2P2002/ S/P:spontaneous vaginal, 1st degree laceration Doing well - stable for discharge home  Plan: Discharge home RX's:  Ibuprofen  po Q 6 hrs prn pain #30 Refill x 0 Routine pp visit in 6 wks at [facility] Ob/Gyn booklet given    Donette Larry, N MSN, CNM 11/07/2014, 11:31 AM

## 2014-11-07 NOTE — Discharge Summary (Signed)
Obstetric Discharge Summary Reason for Admission: induction of labor and [redacted] weeks gestation Prenatal Procedures: Hypothyroidism Intrapartum Procedures: spontaneous vaginal delivery and Pitocin, epidural, AROM Postpartum Procedures: none Complications-Operative and Postpartum: 1st degree perineal laceration HEMOGLOBIN  Date Value Ref Range Status  11/06/2014 10.0* 12.0 - 15.0 g/dL Final   HCT  Date Value Ref Range Status  11/06/2014 29.6* 36.0 - 46.0 % Final    Physical Exam:  General: alert, cooperative and no distress Lochia: appropriate Uterine Fundus: firm Incision: healing well, no significant drainage, no dehiscence, no significant erythema DVT Evaluation: No evidence of DVT seen on physical exam. Negative Homan's sign. No cords or calf tenderness. No significant calf/ankle edema.  Discharge Diagnoses: Term Pregnancy-delivered  Discharge Information: Date: 11/07/2014 Activity: pelvic rest Diet: routine Medications: PNV and Ibuprofen, Cytomel Condition: stable Instructions: refer to practice specific booklet Discharge to: home Follow-up Information    Follow up with LAVOIE,MARIE-LYNE, MD. Schedule an appointment as soon as possible for a visit in 6 weeks.   Specialty:  Obstetrics and Gynecology   Contact information:   [address] [place] [address] [phone]       Newborn Data: Live born female on [birth year] Birth Weight: 7 lb 0.9 oz (3200 g) APGAR: 9, 9  Home with mother.  Puja Caffey, N 11/07/2014, 11:32 AM
